# Patient Record
Sex: Female | Born: 1995 | Race: White | Hispanic: No | Marital: Single | Smoking: Never smoker
Health system: Southern US, Community
[De-identification: ages and names within clinical notes are randomized; demographics above are authoritative.]

## PROBLEM LIST (undated history)

## (undated) DIAGNOSIS — J45909 Unspecified asthma, uncomplicated: Secondary | ICD-10-CM

## (undated) DIAGNOSIS — B009 Herpesviral infection, unspecified: Secondary | ICD-10-CM

## (undated) HISTORY — PX: WISDOM TOOTH EXTRACTION: SHX21

## (undated) HISTORY — DX: Unspecified asthma, uncomplicated: J45.909

## (undated) HISTORY — DX: Herpesviral infection, unspecified: B00.9

---
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
## 2019-06-18 ENCOUNTER — Other Ambulatory Visit: Payer: Self-pay | Admitting: *Deleted

## 2019-06-18 DIAGNOSIS — Z20822 Contact with and (suspected) exposure to covid-19: Secondary | ICD-10-CM

## 2019-06-20 LAB — NOVEL CORONAVIRUS, NAA: SARS-CoV-2, NAA: NOT DETECTED

## 2019-06-21 ENCOUNTER — Telehealth: Payer: Self-pay | Admitting: General Practice

## 2019-06-21 NOTE — Telephone Encounter (Signed)
Patient is calling to receive her negative COVID test results. Patient expressed understanding. 

## 2020-06-08 LAB — OB RESULTS CONSOLE GBS: GBS: POSITIVE

## 2020-06-19 LAB — OB RESULTS CONSOLE RPR: RPR: NONREACTIVE

## 2020-06-19 LAB — OB RESULTS CONSOLE HIV ANTIBODY (ROUTINE TESTING): HIV: NONREACTIVE

## 2020-06-19 LAB — OB RESULTS CONSOLE ABO/RH: RH Type: POSITIVE

## 2020-06-19 LAB — OB RESULTS CONSOLE HEPATITIS B SURFACE ANTIGEN: Hepatitis B Surface Ag: NEGATIVE

## 2020-06-19 LAB — OB RESULTS CONSOLE RUBELLA ANTIBODY, IGM: Rubella: IMMUNE

## 2020-06-19 LAB — OB RESULTS CONSOLE GC/CHLAMYDIA
Chlamydia: NEGATIVE
Gonorrhea: NEGATIVE

## 2020-06-19 LAB — HEPATITIS C ANTIBODY: HCV Ab: NEGATIVE

## 2020-07-26 ENCOUNTER — Other Ambulatory Visit: Payer: Self-pay | Admitting: Obstetrics & Gynecology

## 2020-07-26 DIAGNOSIS — Z363 Encounter for antenatal screening for malformations: Secondary | ICD-10-CM

## 2020-08-12 NOTE — L&D Delivery Note (Signed)
Delivery Note Colleen Daniel is a G1P0 at [redacted]w[redacted]d who had a precipitous spontaneous delivery at 33  a viable female was delivered via ROA with compound hand presentation.  APGAR: 8, 9; weight 7lb 2.2oz (3239g(  .    Progressed rapidly in MAU from 2 to 3 to 10cm. I was called to attend delivery at 10:30, found patient to be 10/100/+3 and bearing down with contractions. AROM clear fluid at 1033, pushed effectively to deliver at 1036.  No nuchal cord. 60 second delayed cord clamping performed. Placenta delivery spontaneously and intact. IM pitocin given. IM toradol given  Vaginal exam limited by patient discomfort, small superficial right vaginal laceration noted but rendered hemostatic with pressure. Uterine fundus firm and good hemostasis noted.   Instrument and gauze counts were correct at the end of the procedure.   Placenta status: to L&D  Anesthesia:  none Episiotomy:  none Lacerations:   superficial right vaginal laceration did not require repair Suture Repair: none Est. Blood Loss (mL):   Mom to postpartum.  Baby to Couplet care / Skin to Skin.  Charlett Nose 01/05/2021, 11:12 AM

## 2020-08-17 ENCOUNTER — Encounter: Payer: Self-pay | Admitting: *Deleted

## 2020-08-21 ENCOUNTER — Other Ambulatory Visit: Payer: Self-pay | Admitting: *Deleted

## 2020-08-21 ENCOUNTER — Ambulatory Visit: Payer: BC Managed Care – PPO | Attending: Obstetrics & Gynecology

## 2020-08-21 ENCOUNTER — Other Ambulatory Visit: Payer: Self-pay

## 2020-08-21 DIAGNOSIS — Z363 Encounter for antenatal screening for malformations: Secondary | ICD-10-CM | POA: Insufficient documentation

## 2020-08-21 DIAGNOSIS — Z3A19 19 weeks gestation of pregnancy: Secondary | ICD-10-CM

## 2020-08-21 DIAGNOSIS — Z362 Encounter for other antenatal screening follow-up: Secondary | ICD-10-CM

## 2020-08-21 IMAGING — US US MFM OB COMP +14 WKS
1 series · 13 of 28 positions shown · non-contrast
Comparison: none

[Series 1: us mfm ob comp +14 wks · 13 of 128 slices shown]
[im 5/128]
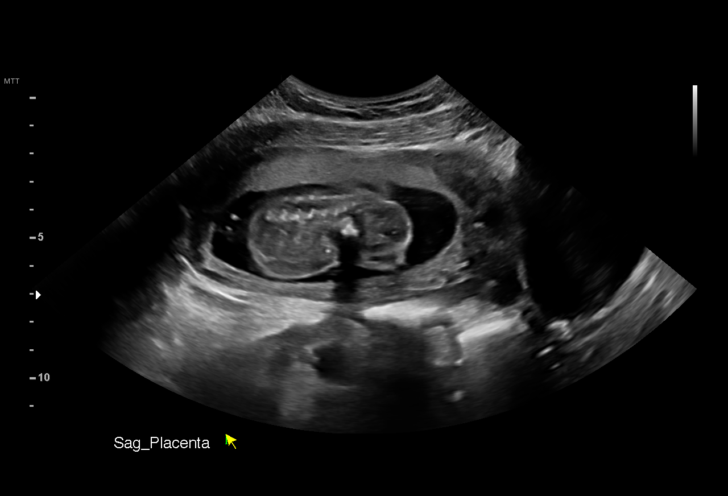
[im 15/128]
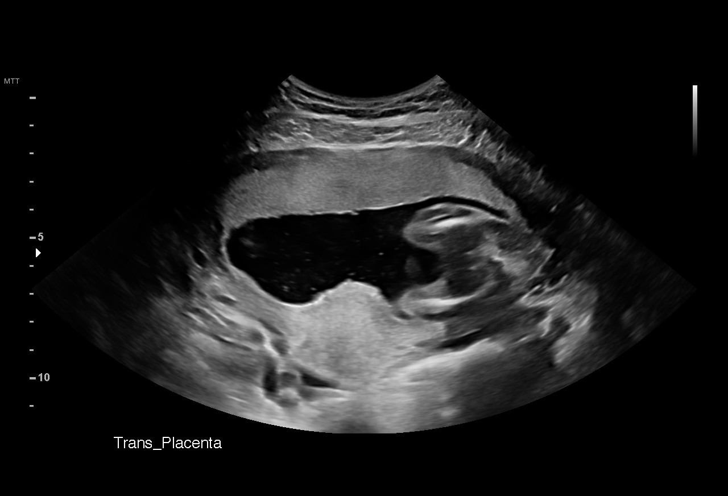
[im 24/128]
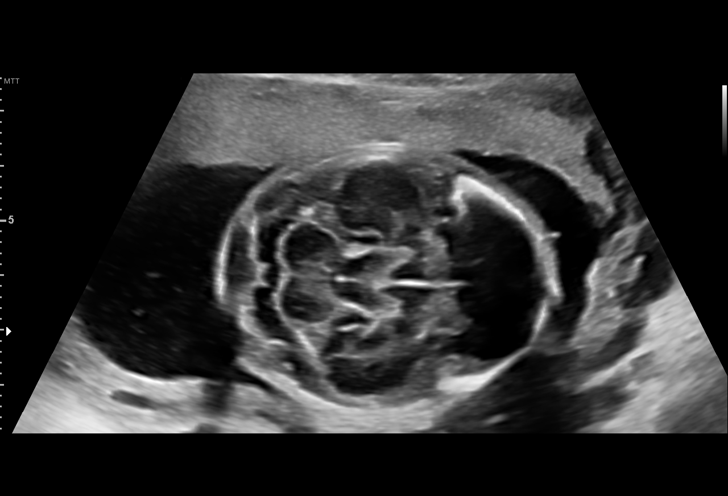
[im 33/128]
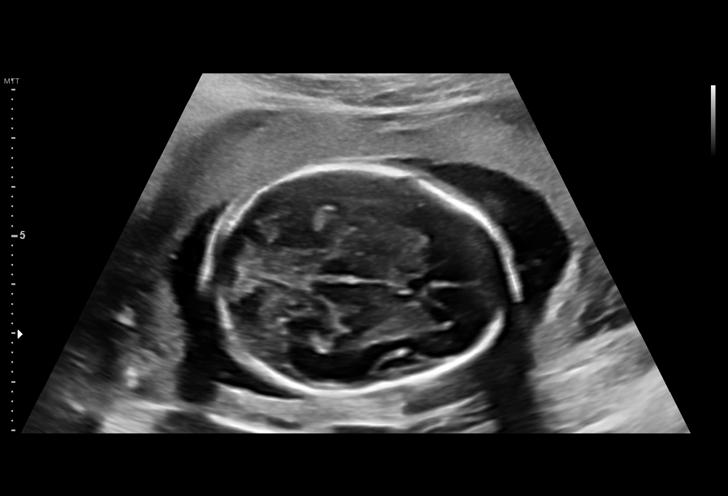
[im 43/128]
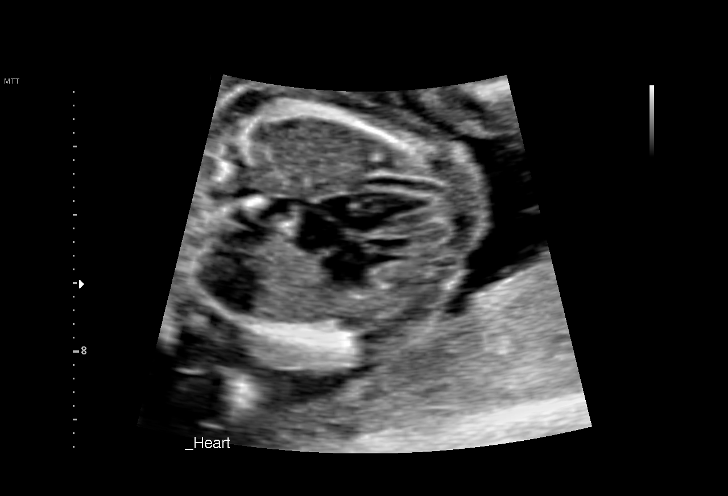
[im 52/128]
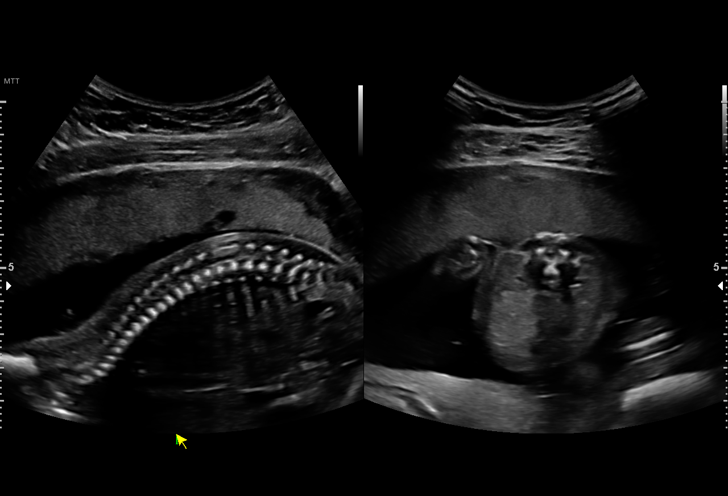
[im 66/128]
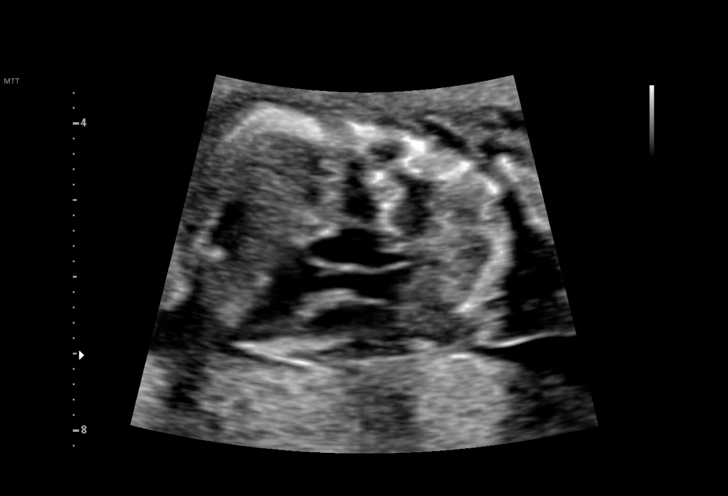
[im 76/128]
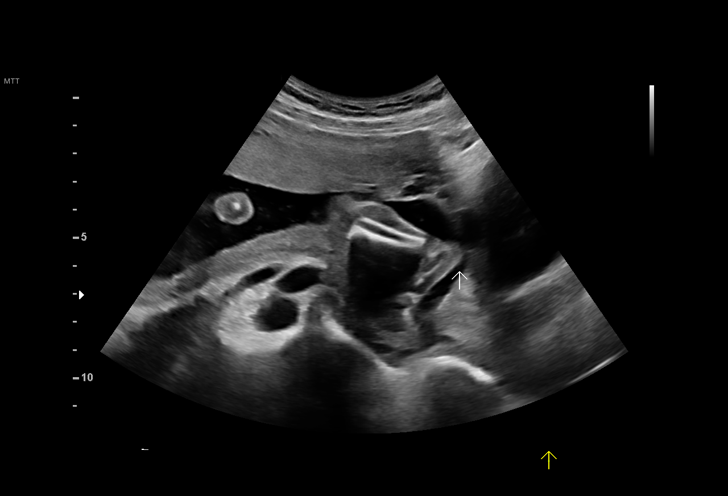
[im 85/128]
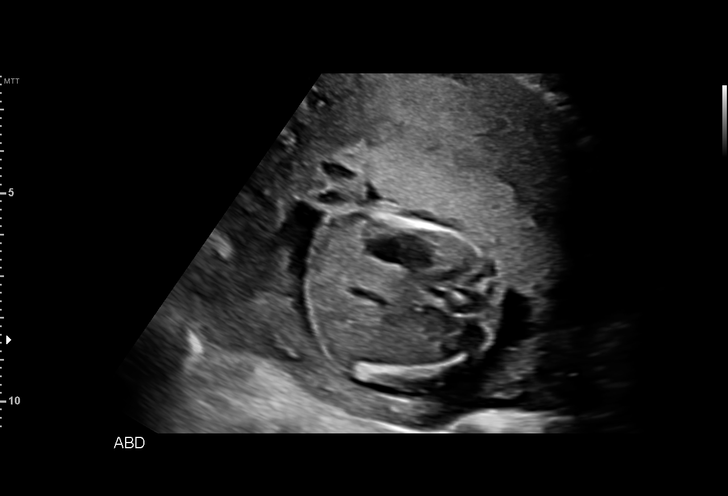
[im 95/128]
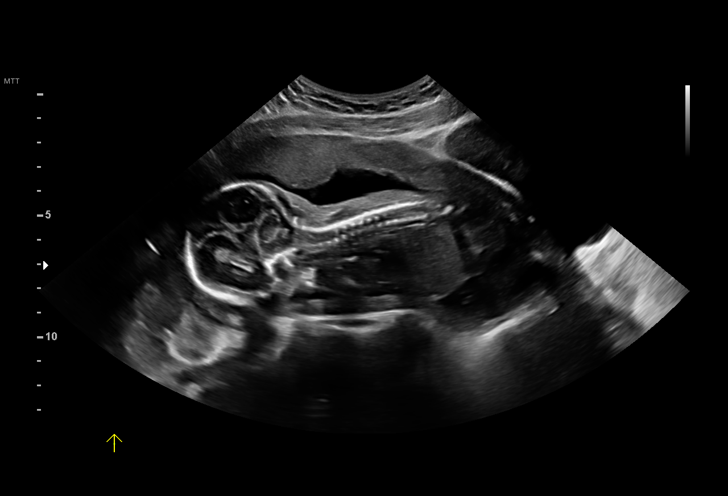
[im 104/128]
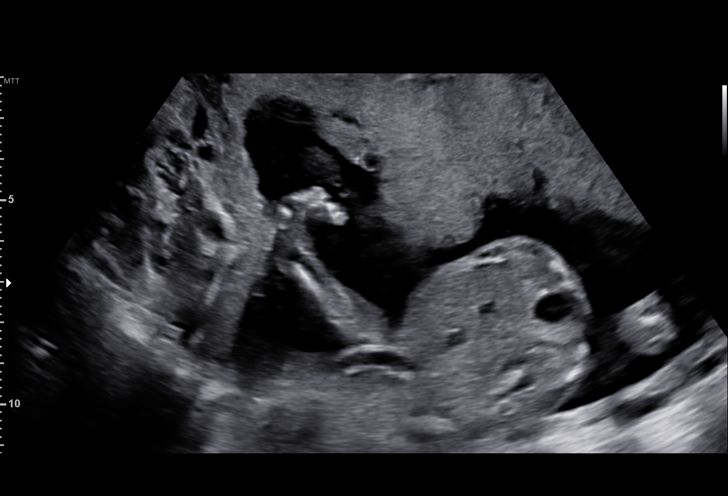
[im 113/128]
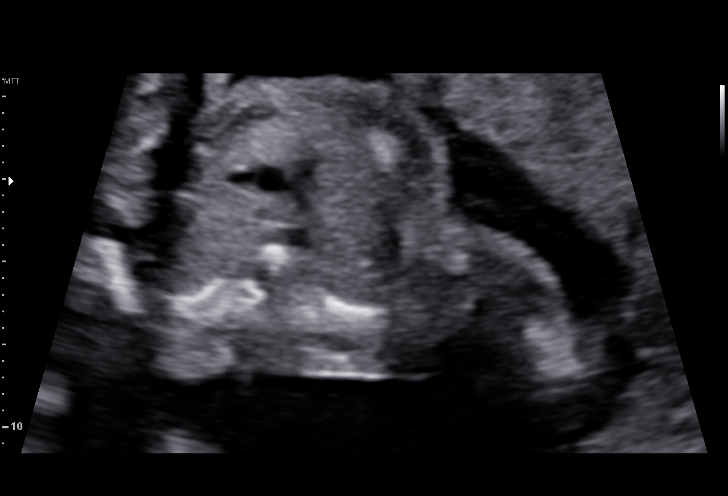
[im 123/128]
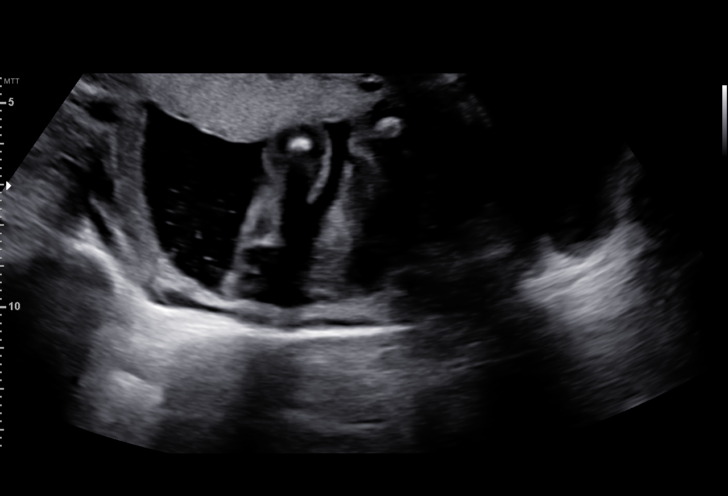

[13 of 28 positions shown; findings below may reference images not displayed]

MOTONORI

 1  US MFM OB COMP + 14 WK                76805.01    MOTONORI
                                                      MOTONORI

Indications

 Encounter for antenatal screening for          [EZ]
 malformations
 19 weeks gestation of pregnancy
 LR NIPS
Fetal Evaluation

 Num Of Fetuses:         1
 Fetal Heart Rate(bpm):  162
 Cardiac Activity:       Observed
 Presentation:           Variable
 Placenta:               Anterior
 P. Cord Insertion:      Visualized, central

 Amniotic Fluid
 AFI FV:      Within normal limits

                             Largest Pocket(cm)

Biometry

 BPD:      46.9  mm     G. Age:  20w 1d         80  %    CI:        72.64   %    70 - 86
                                                         FL/HC:      17.2   %    16.1 -
 HC:       175   mm     G. Age:  20w 0d         71  %    HC/AC:      1.20        1.09 -
 AC:      146.4  mm     G. Age:  20w 0d         63  %    FL/BPD:     64.2   %
 FL:       30.1  mm     G. Age:  19w 2d         38  %    FL/AC:      20.6   %    20 - 24
 HUM:      29.2  mm     G. Age:  19w 4d         54  %
 CER:      20.5  mm     G. Age:  19w 5d         64  %
 NFT:       5.2  mm
 LV:        7.3  mm
 CM:        3.7  mm
 Est. FW:     308  gm    0 lb 11 oz      62  %
OB History

 Blood Type:   O+
 Gravidity:    1
Gestational Age

 LMP:           19w 3d        Date:  [DATE]                 EDD:   [DATE]
 U/S Today:     19w 6d                                        EDD:   [DATE]
 Best:          19w 3d     Det. By:  LMP  ([DATE])          EDD:   [DATE]
Anatomy

 Cranium:               Appears normal         Aortic Arch:            Appears normal
 Cavum:                 Appears normal         Ductal Arch:            Not well visualized
 Ventricles:            Appears normal         Diaphragm:              Appears normal
 Choroid Plexus:        Appears normal         Stomach:                Appears normal, left
                                                                       sided
 Cerebellum:            Appears normal         Abdomen:                Appears normal
 Posterior Fossa:       Appears normal         Abdominal Wall:         Appears nml (cord
                                                                       insert, abd wall)
 Nuchal Fold:           Appears normal         Cord Vessels:           Appears normal (3
                                                                       vessel cord)
 Face:                  Orbits nl; profile not Kidneys:                Appear normal
                        well visualized
 Lips:                  Not well visualized    Bladder:                Appears normal
 Thoracic:              Appears normal         Spine:                  Appears normal
 Heart:                 Appears normal         Upper Extremities:      Appears normal
                        (4CH, axis, and
                        situs)
 RVOT:                  Not well visualized    Lower Extremities:      Appears normal
 LVOT:                  Appears normal

 Other:  Fetus appears to be a male. Left Heels and Right 5th digit visualized.
         Technically difficult due to fetal position.
Cervix Uterus Adnexa

 Cervix
 Length:           4.82  cm.
 Normal appearance by transabdominal scan.

 Uterus
 No abnormality visualized.

 Right Ovary
 Within normal limits.

 Left Ovary
 Within normal limits.

 Cul De Sac
 No free fluid seen.

 Adnexa
 No abnormality visualized.
Comments

 This patient was seen for a detailed fetal anatomy scan.
 She denies any significant past medical history and denies
 any problems in her current pregnancy.
 She had a cell free DNA test earlier in her pregnancy which
 indicated a low risk for trisomy 21, 18, and 13. A male fetus is
 predicted.
 She was informed that the fetal growth and amniotic fluid
 level were appropriate for her gestational age.
 There were no obvious fetal anomalies noted on today's
 ultrasound exam.  However, the views of the fetal anatomy
 were limited today due to the fetal position.
 The patient was informed that anomalies may be missed due
 to technical limitations. If the fetus is in a suboptimal position
 or maternal habitus is increased, visualization of the fetus in
 the maternal uterus may be impaired.
 A follow-up exam was scheduled in 4 weeks to complete the
 views of the fetal anatomy.

## 2020-09-18 ENCOUNTER — Ambulatory Visit: Payer: BC Managed Care – PPO | Attending: Obstetrics and Gynecology

## 2020-09-18 ENCOUNTER — Encounter: Payer: Self-pay | Admitting: *Deleted

## 2020-09-18 ENCOUNTER — Ambulatory Visit: Payer: BC Managed Care – PPO | Admitting: *Deleted

## 2020-09-18 ENCOUNTER — Other Ambulatory Visit: Payer: Self-pay

## 2020-09-18 VITALS — Ht 66.0 in

## 2020-09-18 DIAGNOSIS — Z362 Encounter for other antenatal screening follow-up: Secondary | ICD-10-CM | POA: Diagnosis present

## 2020-09-18 DIAGNOSIS — Z3A23 23 weeks gestation of pregnancy: Secondary | ICD-10-CM | POA: Diagnosis not present

## 2020-09-18 IMAGING — US US MFM OB FOLLOW-UP
1 series · 14 of 28 positions shown · non-contrast
Comparison: none

[Series 1: us mfm ob follow-up · 14 of 114 slices shown]
[im 5/114]
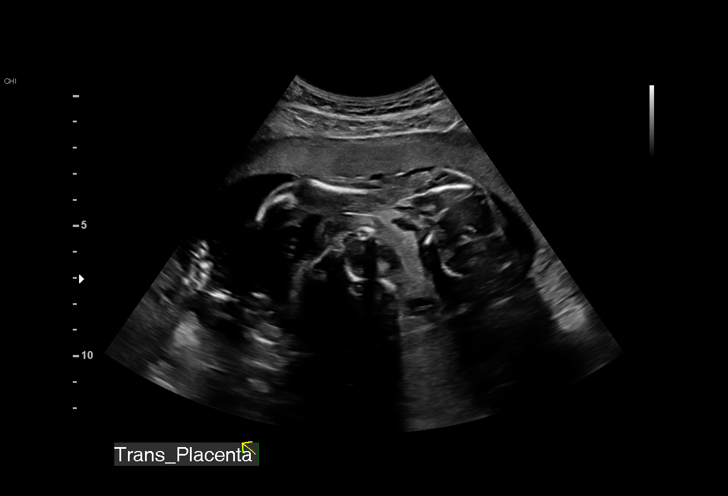
[im 13/114]
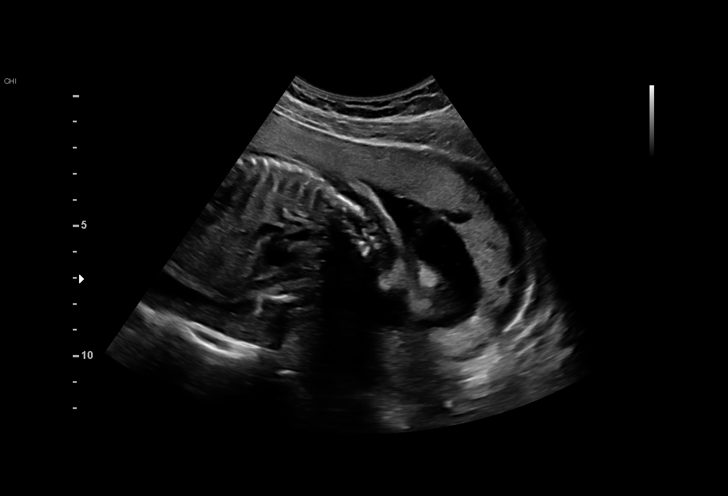
[im 21/114]
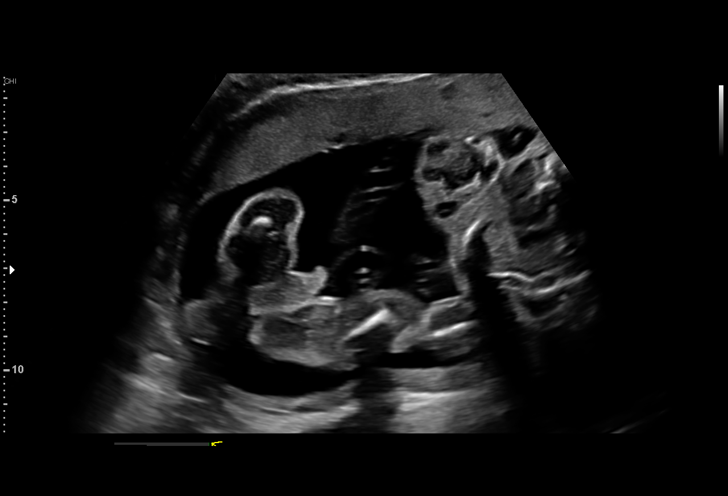
[im 30/114]
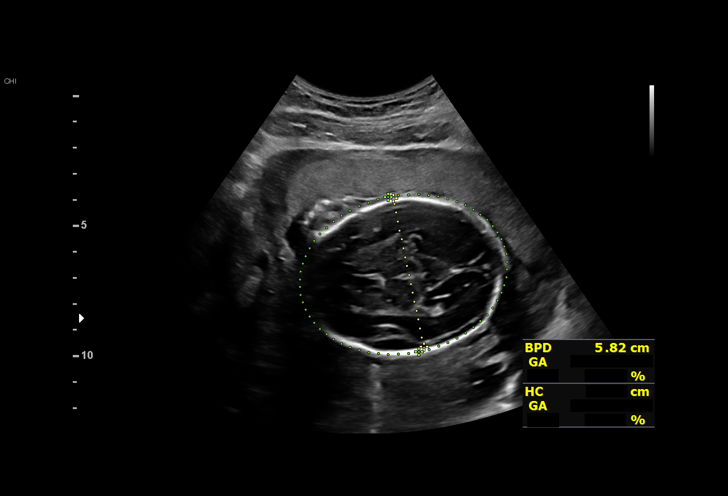
[im 38/114]
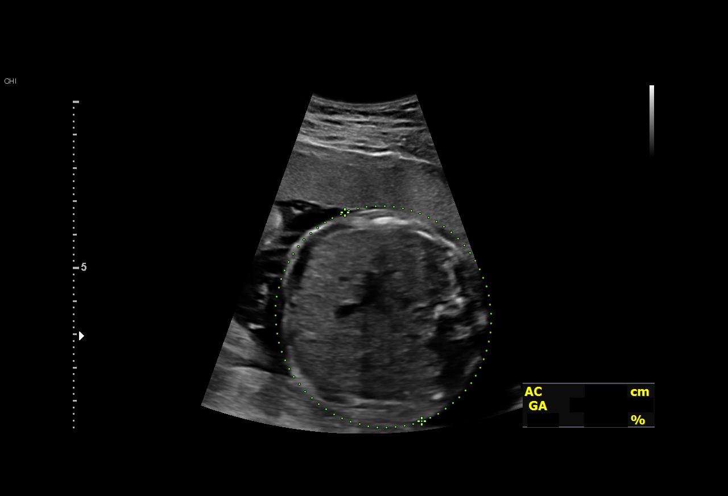
[im 47/114]
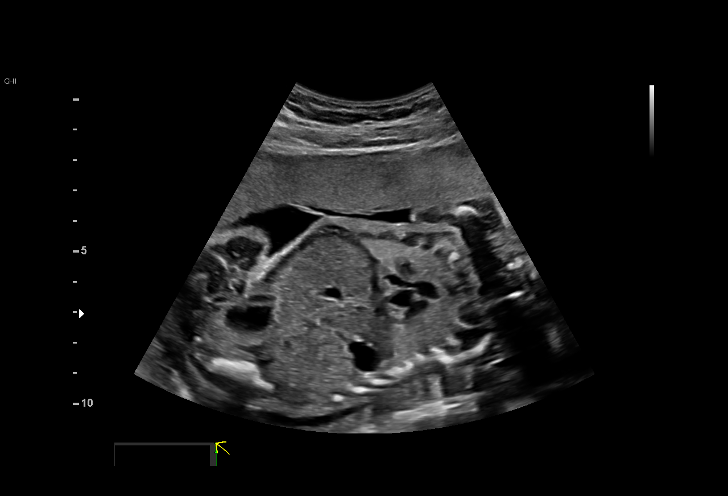
[im 55/114]
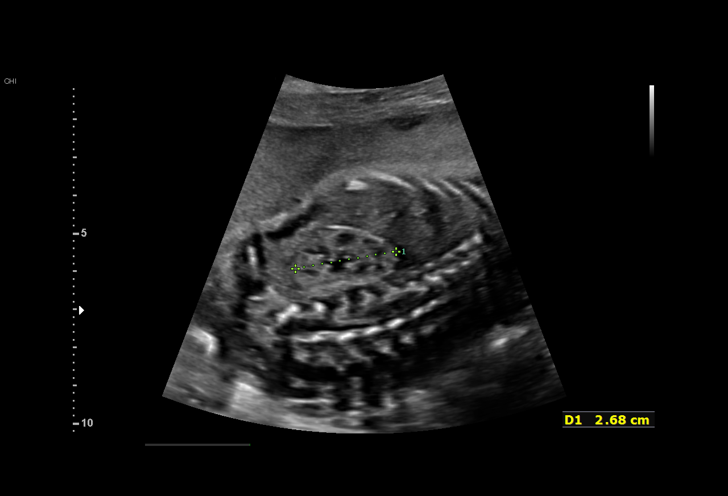
[im 63/114]
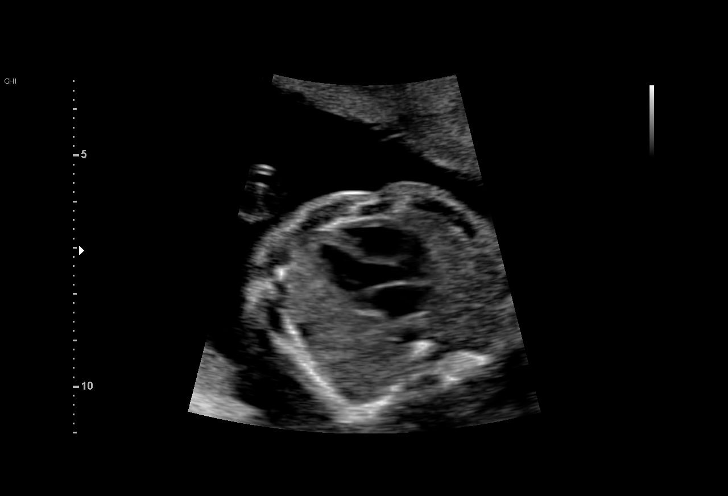
[im 72/114]
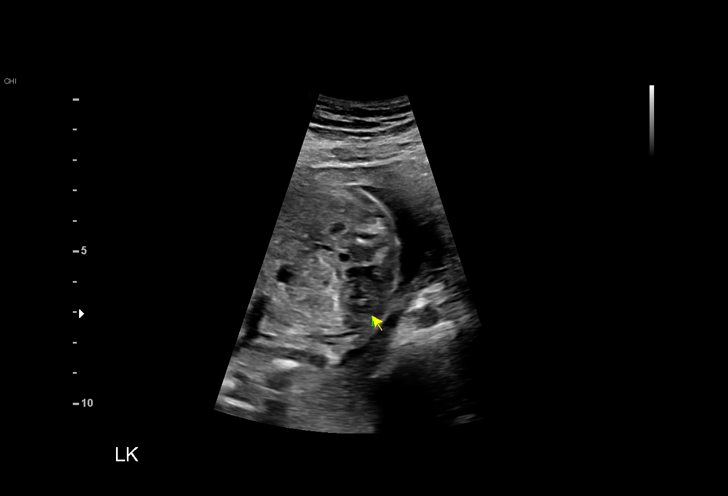
[im 80/114]
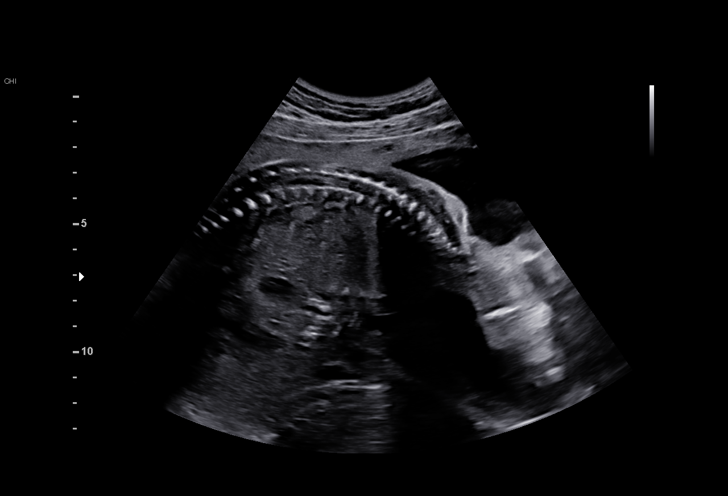
[im 88/114]
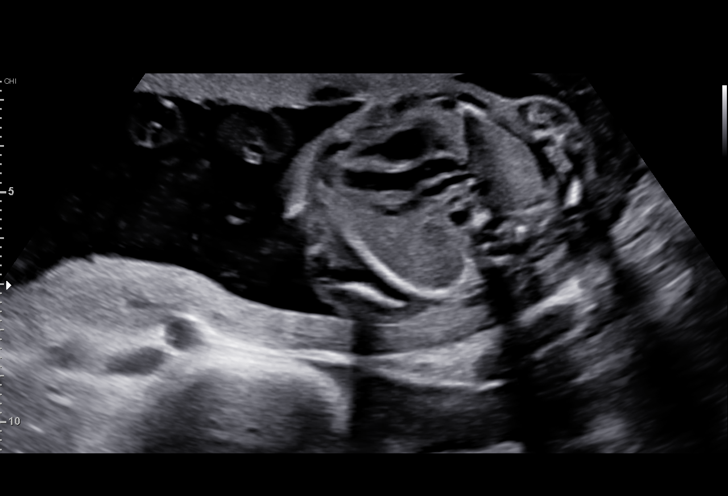
[im 97/114]
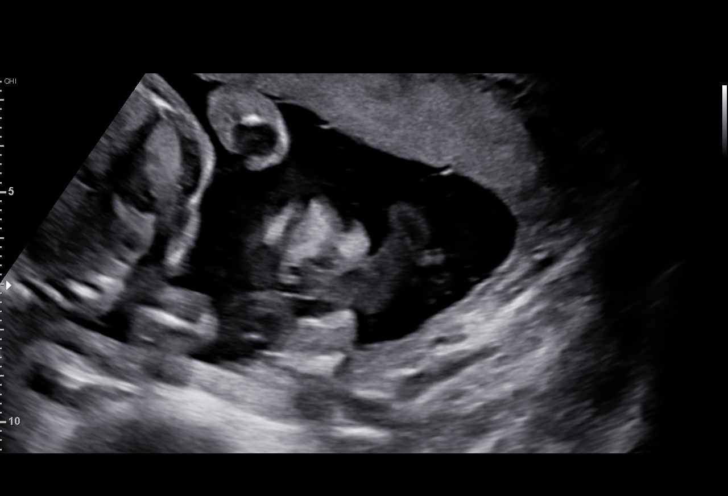
[im 105/114]
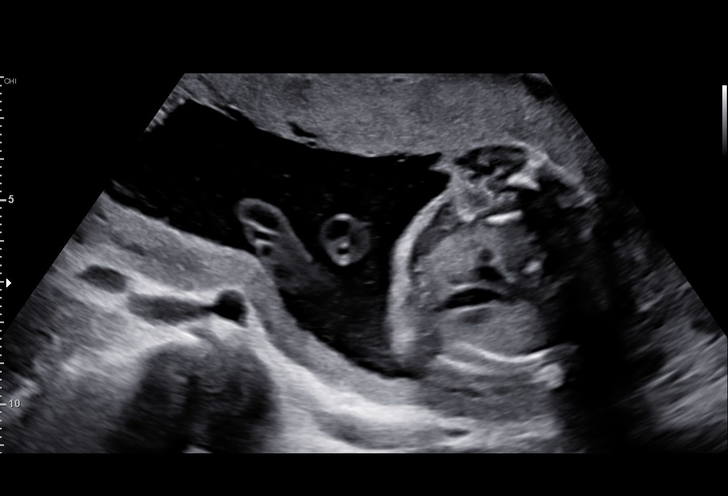
[im 114/114]
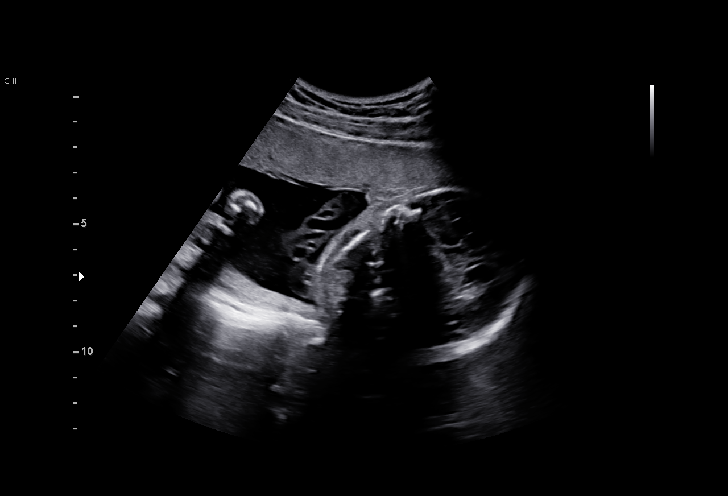

[14 of 28 positions shown; findings below may reference images not displayed]

OBGYN
                   VADIVELU

Indications

 Antenatal follow-up for nonvisualized fetal
 anatomy
 23 weeks gestation of pregnancy
Fetal Evaluation

 Num Of Fetuses:         1
 Fetal Heart Rate(bpm):  143
 Cardiac Activity:       Observed
 Presentation:           Cephalic
 Placenta:               Anterior
 P. Cord Insertion:      Previously Visualized

 Amniotic Fluid
 AFI FV:      Within normal limits

                             Largest Pocket(cm)

Biometry

 BPD:      58.3  mm     G. Age:  23w 6d         62  %    CI:        69.67   %    70 - 86
                                                         FL/HC:      18.7   %    19.2 -
 HC:      222.9  mm     G. Age:  24w 2d         69  %    HC/AC:      1.10        1.05 -
 AC:      202.9  mm     G. Age:  24w 6d         84  %    FL/BPD:     71.4   %    71 - 87
 FL:       41.6  mm     G. Age:  23w 4d         41  %    FL/AC:      20.5   %    20 - 24

 Est. FW:     680  gm      1 lb 8 oz     82  %
OB History

 Blood Type:   O+
 Gravidity:    1
Gestational Age

 LMP:           23w 3d        Date:  04/07/20                 EDD:   01/12/21
 U/S Today:     24w 1d                                        EDD:   01/07/21
 Best:          23w 3d     Det. By:  LMP  (04/07/20)          EDD:   01/12/21
Anatomy

 Cranium:               Appears normal         Aortic Arch:            Previously seen
 Cavum:                 Appears normal         Ductal Arch:            Appears normal
 Ventricles:            Appears normal         Diaphragm:              Appears normal
 Choroid Plexus:        Appears normal         Stomach:                Appears normal, left
                                                                       sided
 Cerebellum:            Previously seen        Abdomen:                Appears normal
 Posterior Fossa:       Previously seen        Abdominal Wall:         Appears nml (cord
                                                                       insert, abd wall)
 Nuchal Fold:           Previously seen        Cord Vessels:           Appears normal (3
                                                                       vessel cord)
 Face:                  Orbits prev seen;      Kidneys:                Appear normal
                        Profile WNL
 Lips:                  Appears normal         Bladder:                Appears normal
 Thoracic:              Appears normal         Spine:                  Previously seen
 Heart:                 Appears normal         Upper Extremities:      Previously seen
                        (4CH, axis, and
                        situs)
 RVOT:                  Appears normal         Lower Extremities:      Previously seen
 LVOT:                  Appears normal

 Other:  Fetus appears to be a male. Left Heels and Right 5th digit previously
         visualized. 3VV and 3VTV visualized.
Cervix Uterus Adnexa

 Cervix
 Length:            3.8  cm.
 Normal appearance by transabdominal scan.
Comments

 This patient was seen for a follow up exam as the views of
 the fetal anatomy were unable to be fully visualized during
 her last exam.  She denies any problems since her last exam.
 She was informed that the fetal growth and amniotic fluid
 level appears appropriate for her gestational age.
 The views of the fetal anatomy were visualized today.  There
 were no obvious anomalies noted.
 The limitations of ultrasound in the detection of all anomalies
 was discussed.
 Follow-up as indicated.

## 2020-10-16 LAB — OB RESULTS CONSOLE RPR: RPR: NONREACTIVE

## 2021-01-05 ENCOUNTER — Other Ambulatory Visit: Payer: Self-pay

## 2021-01-05 ENCOUNTER — Inpatient Hospital Stay (HOSPITAL_COMMUNITY)
Admission: AD | Admit: 2021-01-05 | Discharge: 2021-01-06 | DRG: 806 | Disposition: A | Discharge status: Home / Self Care | Payer: BC Managed Care – PPO | Attending: Obstetrics and Gynecology | Admitting: Obstetrics and Gynecology

## 2021-01-05 ENCOUNTER — Encounter (HOSPITAL_COMMUNITY): Payer: Self-pay | Admitting: Obstetrics and Gynecology

## 2021-01-05 DIAGNOSIS — Z20822 Contact with and (suspected) exposure to covid-19: Secondary | ICD-10-CM | POA: Diagnosis present

## 2021-01-05 DIAGNOSIS — O322XX0 Maternal care for transverse and oblique lie, not applicable or unspecified: Secondary | ICD-10-CM | POA: Diagnosis present

## 2021-01-05 DIAGNOSIS — O26893 Other specified pregnancy related conditions, third trimester: Secondary | ICD-10-CM | POA: Diagnosis present

## 2021-01-05 DIAGNOSIS — Z3A39 39 weeks gestation of pregnancy: Secondary | ICD-10-CM | POA: Diagnosis not present

## 2021-01-05 DIAGNOSIS — O99824 Streptococcus B carrier state complicating childbirth: Secondary | ICD-10-CM | POA: Diagnosis present

## 2021-01-05 LAB — RESP PANEL BY RT-PCR (FLU A&B, COVID) ARPGX2
Influenza A by PCR: NEGATIVE
Influenza B by PCR: NEGATIVE
SARS Coronavirus 2 by RT PCR: NEGATIVE

## 2021-01-05 LAB — CBC
HCT: 34.6 % — ABNORMAL LOW (ref 36.0–46.0)
Hemoglobin: 12.2 g/dL (ref 12.0–15.0)
MCH: 31.3 pg (ref 26.0–34.0)
MCHC: 35.3 g/dL (ref 30.0–36.0)
MCV: 88.7 fL (ref 80.0–100.0)
Platelets: 263 10*3/uL (ref 150–400)
RBC: 3.9 MIL/uL (ref 3.87–5.11)
RDW: 12.7 % (ref 11.5–15.5)
WBC: 22.9 10*3/uL — ABNORMAL HIGH (ref 4.0–10.5)
nRBC: 0 % (ref 0.0–0.2)

## 2021-01-05 LAB — TYPE AND SCREEN
ABO/RH(D): O POS
Antibody Screen: NEGATIVE

## 2021-01-05 MED ORDER — SOD CITRATE-CITRIC ACID 500-334 MG/5ML PO SOLN: 30.0000 mL | ORAL | Status: DC | PRN

## 2021-01-05 MED ORDER — OXYTOCIN 10 UNIT/ML IJ SOLN
INTRAMUSCULAR | Status: AC
  Filled 2021-01-05: qty 1

## 2021-01-05 MED ORDER — PRENATAL MULTIVITAMIN CH
1.0000 | ORAL_TABLET | Freq: Every day | ORAL | Status: DC
  Administered 2021-01-06: 1 via ORAL
  Filled 2021-01-05: qty 1

## 2021-01-05 MED ORDER — ONDANSETRON HCL 4 MG/2ML IJ SOLN: 4.0000 mg | Freq: Four times a day (QID) | INTRAMUSCULAR | Status: DC | PRN

## 2021-01-05 MED ORDER — BENZOCAINE-MENTHOL 20-0.5 % EX AERO
1.0000 "application " | INHALATION_SPRAY | CUTANEOUS | Status: DC | PRN
  Administered 2021-01-05: 1 via TOPICAL
  Filled 2021-01-05: qty 56

## 2021-01-05 MED ORDER — BISACODYL 10 MG RE SUPP: 10.0000 mg | Freq: Every day | RECTAL | Status: DC | PRN

## 2021-01-05 MED ORDER — OXYTOCIN-SODIUM CHLORIDE 30-0.9 UT/500ML-% IV SOLN: 2.5000 [IU]/h | INTRAVENOUS | Status: DC

## 2021-01-05 MED ORDER — IBUPROFEN 600 MG PO TABS
600.0000 mg | ORAL_TABLET | Freq: Four times a day (QID) | ORAL | Status: DC
  Administered 2021-01-05 – 2021-01-06 (×4): 600 mg via ORAL
  Filled 2021-01-05 (×5): qty 1

## 2021-01-05 MED ORDER — LIDOCAINE HCL (PF) 1 % IJ SOLN
30.0000 mL | INTRAMUSCULAR | Status: DC | PRN
  Filled 2021-01-05 (×2): qty 30

## 2021-01-05 MED ORDER — KETOROLAC TROMETHAMINE 30 MG/ML IJ SOLN
INTRAMUSCULAR | Status: AC
  Administered 2021-01-05: 30 mg via INTRAMUSCULAR
  Filled 2021-01-05: qty 1

## 2021-01-05 MED ORDER — SIMETHICONE 80 MG PO CHEW: 80.0000 mg | CHEWABLE_TABLET | ORAL | Status: DC | PRN

## 2021-01-05 MED ORDER — ONDANSETRON HCL 4 MG PO TABS: 4.0000 mg | ORAL_TABLET | ORAL | Status: DC | PRN

## 2021-01-05 MED ORDER — LACTATED RINGERS IV SOLN: INTRAVENOUS | Status: DC

## 2021-01-05 MED ORDER — OXYCODONE HCL 5 MG PO TABS: 5.0000 mg | ORAL_TABLET | ORAL | Status: DC | PRN

## 2021-01-05 MED ORDER — DIBUCAINE (PERIANAL) 1 % EX OINT: 1.0000 "application " | TOPICAL_OINTMENT | CUTANEOUS | Status: DC | PRN

## 2021-01-05 MED ORDER — ONDANSETRON HCL 4 MG/2ML IJ SOLN: 4.0000 mg | INTRAMUSCULAR | Status: DC | PRN

## 2021-01-05 MED ORDER — WITCH HAZEL-GLYCERIN EX PADS: 1.0000 "application " | MEDICATED_PAD | CUTANEOUS | Status: DC | PRN

## 2021-01-05 MED ORDER — OXYCODONE HCL 5 MG PO TABS: 10.0000 mg | ORAL_TABLET | ORAL | Status: DC | PRN

## 2021-01-05 MED ORDER — ACETAMINOPHEN 325 MG PO TABS: 650.0000 mg | ORAL_TABLET | ORAL | Status: DC | PRN

## 2021-01-05 MED ORDER — SENNOSIDES-DOCUSATE SODIUM 8.6-50 MG PO TABS
2.0000 | ORAL_TABLET | Freq: Every day | ORAL | Status: DC
  Administered 2021-01-06: 2 via ORAL
  Filled 2021-01-05: qty 2

## 2021-01-05 MED ORDER — OXYTOCIN BOLUS FROM INFUSION: 333.0000 mL | Freq: Once | INTRAVENOUS | Status: DC

## 2021-01-05 MED ORDER — KETOROLAC TROMETHAMINE 30 MG/ML IJ SOLN: 30.0000 mg | Freq: Once | INTRAMUSCULAR | Status: DC

## 2021-01-05 MED ORDER — FLEET ENEMA 7-19 GM/118ML RE ENEM: 1.0000 | ENEMA | Freq: Every day | RECTAL | Status: DC | PRN

## 2021-01-05 MED ORDER — DIPHENHYDRAMINE HCL 25 MG PO CAPS: 25.0000 mg | ORAL_CAPSULE | Freq: Four times a day (QID) | ORAL | Status: DC | PRN

## 2021-01-05 MED ORDER — OXYTOCIN 10 UNIT/ML IJ SOLN
10.0000 [IU] | Freq: Once | INTRAMUSCULAR | Status: AC
  Administered 2021-01-05: 10 [IU] via INTRAMUSCULAR

## 2021-01-05 MED ORDER — LACTATED RINGERS IV SOLN: 500.0000 mL | INTRAVENOUS | Status: DC | PRN

## 2021-01-05 MED ORDER — COCONUT OIL OIL: 1.0000 "application " | TOPICAL_OIL | Status: DC | PRN

## 2021-01-05 MED ORDER — TETANUS-DIPHTH-ACELL PERTUSSIS 5-2.5-18.5 LF-MCG/0.5 IM SUSY: 0.5000 mL | PREFILLED_SYRINGE | Freq: Once | INTRAMUSCULAR | Status: DC

## 2021-01-05 NOTE — Lactation Note (Signed)
This note was copied from a baby's chart. Lactation Consultation Note  Patient Name: Colleen Daniel [id number] Date: 01/05/2021   Age:25 hours  Maternal Data   Baby Colleen Wilson Singer now 5 hours old. STS with mom.  A little gaggy at this time. Showed mom how to hand express.  Dad rubbed small drops of colostrum on his gums.  Mom has DEBP for home use.  Mom requested manual pump.  Gave manual pump. Left STS.  Urged mom to call lactation as needed. Feeding    LATCH Score                    Lactation Tools Discussed/Used    Interventions    Discharge    Consult Status      Neomia Dear 01/05/2021, 4:49 PM

## 2021-01-05 NOTE — H&P (Signed)
Colleen Daniel is a 25 y.o. female G1P0 [redacted]w[redacted]d presented to MAU this AM for labor evaluation. Initially exam was 2.5/80/-2, about 1.5 hours later, exam 3/90/-2. She then progressed rapidly to 10/100/+3 and I was called to attend delivery in MAU. See delivery note.   Pregnancy c/b: 1. GBS positive  OB History    Gravida  1   Para      Term      Preterm      AB      Living        SAB      IAB      Ectopic      Multiple      Live Births             Past Medical History:  Diagnosis Date  . Asthma    Childhood   Past Surgical History:  Procedure Laterality Date  . WISDOM TOOTH EXTRACTION     Family History: family history is not on file. Social History:  reports that she has never smoked. She has never used smokeless tobacco. She reports previous alcohol use. She reports that she does not use drugs.     Maternal Diabetes: No Genetic Screening: Normal Maternal Ultrasounds/Referrals: Normal Fetal Ultrasounds or other Referrals:  None Maternal Substance Abuse:  No Significant Maternal Medications:  None Significant Maternal Lab Results:  Group B Strep positive Other Comments:  None  Review of Systems Per HPI Exam Physical Exam  Dilation:  (SVD of viable female) Effacement (%): 90 Station: Plus 3 Exam by:: Erle Crocker, RN Blood pressure 129/74, pulse 91, resp. rate 19, last menstrual period 04/07/2020, SpO2 99 %.  Gen: patient uncomfortable with contractions, bearing down with contractions SVE 10/100/+3 FH  Prenatal labs: ABO, Rh:  O/Positive/-- (11/08 0000) Antibody:   Rubella: Immune (11/08 0000) RPR: Nonreactive (03/07 0000)  HBsAg: Negative (11/08 0000)  HIV: Non-reactive (11/08 0000)  GBS: Positive/-- (10/28 0000)   Assessment/Plan: 24Y G1P0 @ [redacted]w[redacted]d, precipitous SVD in MAU, see delivery note  - GBS+: did not receive antibiotics, AROM just prior to delivery - IM pit, IM toradol - Admit to postpartum, routine postpartum care - Labs  pending    Charlett Nose 01/05/2021, 11:10 AM

## 2021-01-05 NOTE — MAU Note (Signed)
...  Colleen Daniel is a 25 y.o. at [redacted]w[redacted]d here in MAU reporting: CTX since 0830 last night. CTX currently 5 minutes apart. +FM. No VB or LOF. GBS+.  Pain score: 6/10 lower abdominal

## 2021-01-05 NOTE — MAU Provider Note (Signed)
None      S: Ms. Colleen Daniel is a 25 y.o. G1P0 at [redacted]w[redacted]d  who presents to MAU today complaining contractions q five minutes since 0830 last night. She denies vaginal bleeding. She denies LOF. She reports normal fetal movement.    O: BP 129/78   Pulse 77   Resp 19   LMP 04/07/2020   SpO2 99%  GENERAL: Well-developed, well-nourished female in no acute distress.  HEAD: Normocephalic, atraumatic.  CHEST: Normal effort of breathing, regular heart rate ABDOMEN: Soft, nontender, gravid  Cervical exam:  Dilation: 3 Effacement (%): 90 Cervical Position: Middle Station: Plus 3 Presentation: Vertex Exam by:: Erle Crocker, RN   Fetal Monitoring: Baseline: 135 Variability: Mod Accelerations: 15 x 15 Decelerations: None Contractions: 1-3 min   A/P --SIUP at [redacted]w[redacted]d  --Reactive tracing --GBS + --Patient with negligible change after initial 90 minutes of observation --Given additional hour to ambulate in room --Patient with new onset very loud grunting, CNM and Charge RN at bedside, patient endorsing new and acute onset urge to push, rocking on commode --Patient assisted to bed by [facility] RN and CNM.  --Determined by CNM to be C/C/+2, involuntarily pushing --Patient not stable for transfer to L&D, not stable for transfer to room 20 --Dr. Timothy Lasso notified, at bedside within three minutes, and present for AROM and birth  Calvert Cantor, [place] 01/05/2021 12:17 PM

## 2021-01-06 LAB — CBC
HCT: 28.7 % — ABNORMAL LOW (ref 36.0–46.0)
Hemoglobin: 10.1 g/dL — ABNORMAL LOW (ref 12.0–15.0)
MCH: 31.5 pg (ref 26.0–34.0)
MCHC: 35.2 g/dL (ref 30.0–36.0)
MCV: 89.4 fL (ref 80.0–100.0)
Platelets: 247 10*3/uL (ref 150–400)
RBC: 3.21 MIL/uL — ABNORMAL LOW (ref 3.87–5.11)
RDW: 13 % (ref 11.5–15.5)
WBC: 16.7 10*3/uL — ABNORMAL HIGH (ref 4.0–10.5)
nRBC: 0 % (ref 0.0–0.2)

## 2021-01-06 LAB — RPR: RPR Ser Ql: NONREACTIVE

## 2021-01-06 MED ORDER — IBUPROFEN 200 MG PO TABS: 600.0000 mg | ORAL_TABLET | Freq: Four times a day (QID) | ORAL | Status: AC | PRN

## 2021-01-06 MED ORDER — ACETAMINOPHEN 325 MG PO TABS: 650.0000 mg | ORAL_TABLET | Freq: Four times a day (QID) | ORAL | Status: AC | PRN

## 2021-01-06 NOTE — Discharge Summary (Signed)
Postpartum Discharge Summary       Patient Name: LOIDA CALAMIA DOB: 08/17/1995 MRN: [id number]  Date of admission: 01/05/2021 Delivery date:01/05/2021  Delivering provider: Irene Pap E  Date of discharge: 01/06/2021  Admitting diagnosis: Normal labor [O80, Z37.9] Intrauterine pregnancy: [redacted]w[redacted]d     Secondary diagnosis:  Active Problems:   Normal labor  Additional problems: none    Discharge diagnosis: Term Pregnancy Delivered                                              Post partum procedures:none Augmentation: AROM Complications: None  Hospital course: Onset of Labor With Vaginal Delivery      25 y.o. yo G1P1001 at [redacted]w[redacted]d was admitted in Active Labor on 01/05/2021. Patient delivered precipitously as follows:  Membrane Rupture Time/Date: 10:33 AM ,01/05/2021   Delivery Method:Vaginal, Spontaneous  Episiotomy: None  Lacerations:  None  Patient had an uncomplicated postpartum course.  She is ambulating, tolerating a regular diet, passing flatus, and urinating well. Patient is discharged home in stable condition on 01/06/21.  Newborn Data: Birth date:01/05/2021  Birth time:10:36 AM  Gender:Female  Living status:Living  Apgars:8 ,9  Weight:3239 g   Magnesium Sulfate received: No BMZ received: No Rhophylac:N/A MMR:N/A T-DaP:Given prenatally Flu: No Transfusion:No  Physical exam  Vitals:   01/05/21 2047 01/05/21 2100 01/06/21 0034 01/06/21 0430  BP:  114/68 110/67 127/68  Pulse:  81 78 76  Resp:  $Remo'18 18 18  'YMppQ$ Temp:  97.8 F (36.6 C) 98 F (36.7 C) 98 F (36.7 C)  TempSrc:  Oral Oral Oral  SpO2: 99% 98% 98%    General: alert, cooperative and no distress Lochia: appropriate Uterine Fundus: firm Incision: N/A DVT Evaluation: No evidence of DVT seen on physical exam. Labs: Lab Results  Component Value Date   WBC 16.7 (H) 01/06/2021   HGB 10.1 (L) 01/06/2021   HCT 28.7 (L) 01/06/2021   MCV 89.4 01/06/2021   PLT 247 01/06/2021   No flowsheet data  found. Edinburgh Score: No flowsheet data found.    After visit meds:  Allergies as of 01/06/2021   No Known Allergies     Medication List    TAKE these medications   acetaminophen 325 MG tablet Commonly known as: TYLENOL Take 2 tablets (650 mg total) by mouth every 6 (six) hours as needed for moderate pain.   ibuprofen 200 MG tablet Commonly known as: ADVIL Take 3 tablets (600 mg total) by mouth every 6 (six) hours as needed for cramping.   PRENATAL VITAMIN PO Take by mouth.        Discharge home in stable condition Infant Feeding: Breast Infant Disposition:home with mother Discharge instruction: per After Visit Summary and Postpartum booklet. Activity: Advance as tolerated. Pelvic rest for 6 weeks.  Diet: routine diet Anticipated Birth Control: Unsure Postpartum Appointment:4 weeks Additional Postpartum F/U: none Future Appointments:No future appointments. Follow up Visit:  Follow-up Information    Rowland Lathe, MD Follow up in 4 week(s).   Specialty: Obstetrics and Gynecology Contact information: [address] [place] [address] [phone]                   01/06/2021 Rowland Lathe, MD

## 2021-01-06 NOTE — Plan of Care (Signed)

## 2021-01-06 NOTE — Discharge Instructions (Signed)

## 2021-01-06 NOTE — Progress Notes (Addendum)
Post Partum Day 1 Subjective: Colleen Daniel is doing well this morning. Reports mild abdominal cramping that is relieved by ibuprofen. She is ambulating, voiding, tolerating PO. Minimal lochia.   Objective: Patient Vitals for the past 24 hrs:  BP Temp Temp src Pulse Resp SpO2  01/06/21 0430 127/68 98 F (36.7 C) Oral 76 18 --  01/06/21 0034 110/67 98 F (36.7 C) Oral 78 18 98 %  01/05/21 2100 114/68 97.8 F (36.6 C) Oral 81 18 98 %  01/05/21 2047 -- -- -- -- -- 99 %  01/05/21 1731 114/69 98.2 F (36.8 C) -- -- 18 100 %  01/05/21 1315 128/79 98.3 F (36.8 C) Oral 89 18 --  01/05/21 1228 122/84 -- -- 91 18 99 %  01/05/21 1201 126/84 -- -- 93 -- --  01/05/21 1146 127/89 -- -- 88 -- --  01/05/21 1131 105/86 -- -- 88 -- --  01/05/21 1115 128/89 -- -- 94 -- --  01/05/21 1104 129/74 -- -- 91 -- --  01/05/21 1050 (!) 92/56 -- -- 97 -- --    Physical Exam:  General: alert, cooperative and no distress Lochia: appropriate Uterine Fundus: firm DVT Evaluation: No evidence of DVT seen on physical exam.  Recent Labs    01/05/21 1203 01/06/21 0434  WBC 22.9* 16.7*  HGB 12.2 10.1*  HCT 34.6* 28.7*  PLT 263 247    No results for input(s): NA, K, CL, CO2CT, BUN, CREATININE, GLUCOSE, BILITOT, ALT, AST, ALKPHOS, PROT, ALBUMIN in the last 72 hours.  No results for input(s): CALCIUM, MG, PHOS in the last 72 hours.  No results for input(s): PROTIME, APTT, INR in the last 72 hours.  No results for input(s): PROTIME, APTT, INR, FIBRINOGEN in the last 72 hours. Assessment/Plan: Colleen Daniel 24 y.o. G1P1001 PPD#1 sp SVD 1. PPC: continue routine postpartum care 2. Rh pos, rubella immune. S/p tdap prenatally.  3. Desires circumcision however baby noted to have hypospadias by pediatrician so will defer 4. Dispo: discussed pediatrician will likely recommend 48h observation for baby given untreated GBS+ due to precipitous labor. If not, could consider discharge home later today vs tomorrow  LOS: 1  day   Charlett Nose 01/06/2021, 9:23 AM    ADDENDUM: Notified that pediatrician is discharging baby, low risk for infection due to AROM just immediately prior to delivery. Colleen Daniel is requesting discharge. She has excellent support with her mom and husband and uncomplicated course. Will place discharge order.  Alinda Deem, MD 01/06/21 10:06 AM

## 2021-01-06 NOTE — Lactation Note (Signed)
This note was copied from a baby's chart. Lactation Consultation Note  Patient Name: Colleen Daniel [id number] Date: 01/06/2021 Reason for consult: Follow-up assessment;Term;Primapara;1st time breastfeeding Age:25 hours   P1 mother whose infant is now 26 hours old.  This is a term baby at 39+0 weeks.  Baby was asleep at the breast when I arrived.  Offered to assist with waking and latching; mother agreeable.  Attempted to latch, however, baby was too sleepy.  Mother reported good feedings at 0015 and 0315.  Placed him STS on mother's chest where he fell asleep.  Suggested she call her RN/LC for assistance as needed.  Grandmother present.  Mother has a Web designer and a [employer] pump for home use.   Maternal Data Has patient been taught Hand Expression?: Yes Does the patient have breastfeeding experience prior to this delivery?: No  Feeding Mother's Current Feeding Choice: Breast Milk  LATCH Score Latch: Too sleepy or reluctant, no latch achieved, no sucking elicited.  Audible Swallowing: None  Type of Nipple: Everted at rest and after stimulation  Comfort (Breast/Nipple): Soft / non-tender  Hold (Positioning): Assistance needed to correctly position infant at breast and maintain latch.  LATCH Score: 5   Lactation Tools Discussed/Used    Interventions Interventions: Breast feeding basics reviewed;Assisted with latch;Skin to skin;Breast compression;Education  Discharge WIC Program: No  Consult Status Consult Status: Follow-up Date: 01/07/21 Follow-up type: In-patient    Loudon Krakow R Keashia Haskins 01/06/2021, 7:19 AM

## 2021-04-03 ENCOUNTER — Other Ambulatory Visit: Payer: Self-pay | Admitting: Obstetrics and Gynecology

## 2021-04-03 DIAGNOSIS — R319 Hematuria, unspecified: Secondary | ICD-10-CM

## 2022-02-28 DIAGNOSIS — Z01419 Encounter for gynecological examination (general) (routine) without abnormal findings: Secondary | ICD-10-CM | POA: Diagnosis not present

## 2022-06-25 ENCOUNTER — Ambulatory Visit (INDEPENDENT_AMBULATORY_CARE_PROVIDER_SITE_OTHER): Payer: BC Managed Care – PPO | Admitting: Family Medicine

## 2022-06-25 ENCOUNTER — Encounter: Payer: Self-pay | Admitting: Family Medicine

## 2022-06-25 VITALS — BP 123/80 | HR 75 | Ht 66.0 in | Wt 154.0 lb

## 2022-06-25 DIAGNOSIS — Z0000 Encounter for general adult medical examination without abnormal findings: Secondary | ICD-10-CM

## 2022-06-25 NOTE — Patient Instructions (Signed)
Follow up annually.  Consider IUD.  Take care  Dr. Adriana Simas

## 2022-06-26 DIAGNOSIS — Z0000 Encounter for general adult medical examination without abnormal findings: Secondary | ICD-10-CM | POA: Insufficient documentation

## 2022-06-26 NOTE — Progress Notes (Signed)
Subjective:  Patient ID: Colleen Daniel, female    DOB: 12-02-1995  Age: 26 y.o. MRN: [id number]  CC: Chief Complaint  Patient presents with   Establish Care    No regular physician since 26 years of age. No questions/concerns at this time.    HPI:  26 year old female presents to establish care.   Patient follows with OB GYN. Has a 28 month old baby at home. Currently on OCP for contraception.  Does not want flu vaccine. Need records but Tdap and Pap are up to date.   She is overall doing well. Having issues with periods despite OCP. Discussed IUD today. Advised to consider and discuss with OB GYN.   Social Hx   Social History   Socioeconomic History   Marital status: Single    Spouse name: Not on file   Number of children: Not on file   Years of education: Not on file   Highest education level: Not on file  Occupational History   Not on file  Tobacco Use   Smoking status: Never   Smokeless tobacco: Never  Vaping Use   Vaping Use: Never used  Substance and Sexual Activity   Alcohol use: Not Currently    Comment: social   Drug use: Never   Sexual activity: Yes  Other Topics Concern   Not on file  Social History Narrative   Not on file   Social Determinants of Health   Financial Resource Strain: Not on file  Food Insecurity: Not on file  Transportation Needs: Not on file  Physical Activity: Not on file  Stress: Not on file  Social Connections: Not on file    Review of Systems  Constitutional: Negative.   Respiratory: Negative.    Cardiovascular: Negative.    Objective:  BP 123/80   Pulse 75   Ht 5\' 6"  (1.676 m)   Wt 154 lb (69.9 kg)   SpO2 96%   BMI 24.86 kg/m      06/25/2022    2:39 PM 01/06/2021    4:30 AM 01/06/2021   12:34 AM  BP/Weight  Systolic BP 123 127 110  Diastolic BP 80 68 67  Wt. (Lbs) 154    BMI 24.86 kg/m2      Physical Exam Vitals and nursing note reviewed.  Constitutional:      General: She is not in acute distress.     Appearance: Normal appearance.  HENT:     Head: Normocephalic and atraumatic.     Nose: Nose normal.     Mouth/Throat:     Pharynx: Oropharynx is clear.  Eyes:     General:        Right eye: No discharge.        Left eye: No discharge.     Conjunctiva/sclera: Conjunctivae normal.  Cardiovascular:     Rate and Rhythm: Normal rate and regular rhythm.  Pulmonary:     Effort: Pulmonary effort is normal.     Breath sounds: Normal breath sounds. No wheezing or rales.  Abdominal:     General: There is no distension.     Palpations: Abdomen is soft.     Tenderness: There is no abdominal tenderness.  Skin:    General: Skin is warm.     Findings: No rash.  Neurological:     General: No focal deficit present.     Mental Status: She is alert.  Psychiatric:        Mood and Affect: Mood  normal.        Behavior: Behavior normal.    Assessment & Plan:   Problem List Items Addressed This Visit       Other   Annual physical exam - Primary    Doing well. Declines flu vaccine. Declines labs. Discussed IUD. Follow up annually.       Follow-up:  Annually  Everlene Other DO [facility]

## 2022-06-26 NOTE — Assessment & Plan Note (Signed)
Doing well. Declines flu vaccine. Declines labs. Discussed IUD. Follow up annually.

## 2022-09-16 DIAGNOSIS — R309 Painful micturition, unspecified: Secondary | ICD-10-CM | POA: Diagnosis not present

## 2023-03-10 DIAGNOSIS — Z01419 Encounter for gynecological examination (general) (routine) without abnormal findings: Secondary | ICD-10-CM | POA: Diagnosis not present

## 2023-03-10 DIAGNOSIS — Z124 Encounter for screening for malignant neoplasm of cervix: Secondary | ICD-10-CM | POA: Diagnosis not present

## 2023-03-10 DIAGNOSIS — Z6823 Body mass index (BMI) 23.0-23.9, adult: Secondary | ICD-10-CM | POA: Diagnosis not present

## 2023-06-02 ENCOUNTER — Telehealth: Payer: Self-pay | Admitting: Family Medicine

## 2023-06-02 DIAGNOSIS — Z131 Encounter for screening for diabetes mellitus: Secondary | ICD-10-CM

## 2023-06-02 DIAGNOSIS — Z130 Encounter for screening for diseases of the blood and blood-forming organs and certain disorders involving the immune mechanism: Secondary | ICD-10-CM

## 2023-06-02 DIAGNOSIS — Z13220 Encounter for screening for lipoid disorders: Secondary | ICD-10-CM

## 2023-06-02 NOTE — Telephone Encounter (Signed)
Patient has physical 11/21 and would like to have labs done before physical this is for her insurance premiums.

## 2023-06-03 ENCOUNTER — Encounter: Payer: Self-pay | Admitting: *Deleted

## 2023-06-03 NOTE — Telephone Encounter (Signed)
Blood work ordered in EPIC. Patient notified via my chart 

## 2023-06-03 NOTE — Telephone Encounter (Signed)
Cook, Jayce G, DO     CBC, CMP, Lipid.   

## 2023-07-03 ENCOUNTER — Encounter: Payer: Self-pay | Admitting: Family Medicine

## 2023-07-03 ENCOUNTER — Ambulatory Visit: Payer: BC Managed Care – PPO | Admitting: Family Medicine

## 2023-07-03 VITALS — BP 116/78 | HR 71 | Temp 97.9°F | Ht 66.0 in | Wt 143.0 lb

## 2023-07-03 DIAGNOSIS — Z0001 Encounter for general adult medical examination with abnormal findings: Secondary | ICD-10-CM

## 2023-07-03 DIAGNOSIS — Z13220 Encounter for screening for lipoid disorders: Secondary | ICD-10-CM

## 2023-07-03 DIAGNOSIS — Z0000 Encounter for general adult medical examination without abnormal findings: Secondary | ICD-10-CM

## 2023-07-03 DIAGNOSIS — Z13228 Encounter for screening for other metabolic disorders: Secondary | ICD-10-CM

## 2023-07-03 DIAGNOSIS — Z130 Encounter for screening for diseases of the blood and blood-forming organs and certain disorders involving the immune mechanism: Secondary | ICD-10-CM

## 2023-07-03 DIAGNOSIS — G43909 Migraine, unspecified, not intractable, without status migrainosus: Secondary | ICD-10-CM

## 2023-07-03 MED ORDER — RIZATRIPTAN BENZOATE 10 MG PO TBDP: 10.0000 mg | ORAL_TABLET | ORAL | 0 refills | Status: AC | PRN

## 2023-07-03 NOTE — Patient Instructions (Signed)
Labs today.  Medication as needed.  Take care  Dr. Adriana Simas

## 2023-07-03 NOTE — Progress Notes (Signed)
Subjective:  Patient ID: Colleen Daniel, female    DOB: 1995-12-13  Age: 27 y.o. MRN: [id number]  CC:   Chief Complaint  Patient presents with   Annual Exam    Discuss ocular migraines, irregular periods and wants to stop BCP See GYN for yearly paps    HPI:  27 year old female presents for an annual exam.  Patient is overall doing well.  She does report that she is having irregular cycles.  She desires pregnancy in the near future.  She would like to discuss stopping her birth control.  Patient also reports ongoing migraines.  She states that it is preceded by visual disturbance.  She takes ibuprofen.  Will discuss prophylactic and abortive treatment today.  Cervical cancer screening is up-to-date.  Need records from OB/GYN.  Declines influenza vaccine.  Declines COVID-19 vaccine.  She should be up-to-date on Tdap as her child is less than the age of 59.  Patient Active Problem List   Diagnosis Date Noted   Migraine 07/03/2023   Annual physical exam 06/26/2022    Social Hx   Social History   Socioeconomic History   Marital status: Single    Spouse name: Not on file   Number of children: Not on file   Years of education: Not on file   Highest education level: Not on file  Occupational History   Not on file  Tobacco Use   Smoking status: Never   Smokeless tobacco: Never  Vaping Use   Vaping status: Never Used  Substance and Sexual Activity   Alcohol use: Not Currently    Comment: social   Drug use: Never   Sexual activity: Yes  Other Topics Concern   Not on file  Social History Narrative   Not on file   Social Determinants of Health   Financial Resource Strain: Not on file  Food Insecurity: Not on file  Transportation Needs: Not on file  Physical Activity: Not on file  Stress: Not on file  Social Connections: Not on file    Review of Systems Per HPI  Objective:  BP 116/78   Pulse 71   Temp 97.9 F (36.6 C)   Ht 5\' 6"  (1.676 m)   Wt 143 lb (64.9  kg)   LMP 05/20/2023 (Exact Date)   SpO2 99%   BMI 23.08 kg/m      07/03/2023    1:22 PM 06/25/2022    2:39 PM 01/06/2021    4:30 AM  BP/Weight  Systolic BP 116 123 127  Diastolic BP 78 80 68  Wt. (Lbs) 143 154   BMI 23.08 kg/m2 24.86 kg/m2     Physical Exam Vitals and nursing note reviewed.  Constitutional:      General: She is not in acute distress.    Appearance: Normal appearance.  HENT:     Head: Normocephalic and atraumatic.  Eyes:     General:        Right eye: No discharge.        Left eye: No discharge.     Conjunctiva/sclera: Conjunctivae normal.  Cardiovascular:     Rate and Rhythm: Normal rate and regular rhythm.  Pulmonary:     Effort: Pulmonary effort is normal.     Breath sounds: Normal breath sounds. No wheezing, rhonchi or rales.  Neurological:     Mental Status: She is alert.  Psychiatric:        Mood and Affect: Mood normal.  Behavior: Behavior normal.     Lab Results  Component Value Date   WBC 16.7 (H) 01/06/2021   HGB 10.1 (L) 01/06/2021   HCT 28.7 (L) 01/06/2021   PLT 247 01/06/2021     Assessment & Plan:   Problem List Items Addressed This Visit       Cardiovascular and Mediastinum   Migraine    Patient does not desire preventative treatment at this time.  She states that her migraines are not frequent enough to warrant preventative treatment.  Rx sent for Maxalt for abortive treatment.      Relevant Medications   rizatriptan (MAXALT-MLT) 10 MG disintegrating tablet     Other   Annual physical exam - Primary    Preventative health care is up-to-date.  She declines flu vaccine and COVID-19 vaccine.  Tetanus should be up-to-date.  Need records regarding the date of her last Pap smear. Screening labs today.      Other Visit Diagnoses     Screening for deficiency anemia       Relevant Orders   CBC   Screening, lipid       Relevant Orders   Lipid panel   Screening for metabolic disorder       Relevant Orders    CMP14+EGFR       Meds ordered this encounter  Medications   rizatriptan (MAXALT-MLT) 10 MG disintegrating tablet    Sig: Take 1 tablet (10 mg total) by mouth as needed for migraine. May repeat in 2 hours if needed    Dispense:  10 tablet    Refill:  0    Follow-up:  Annually  Everlene Other DO [facility]

## 2023-07-03 NOTE — Assessment & Plan Note (Signed)
Patient does not desire preventative treatment at this time.  She states that her migraines are not frequent enough to warrant preventative treatment.  Rx sent for Maxalt for abortive treatment.

## 2023-07-03 NOTE — Assessment & Plan Note (Signed)
Preventative health care is up-to-date.  She declines flu vaccine and COVID-19 vaccine.  Tetanus should be up-to-date.  Need records regarding the date of her last Pap smear. Screening labs today.

## 2023-09-16 DIAGNOSIS — S233XXA Sprain of ligaments of thoracic spine, initial encounter: Secondary | ICD-10-CM | POA: Diagnosis not present

## 2023-09-16 DIAGNOSIS — S338XXA Sprain of other parts of lumbar spine and pelvis, initial encounter: Secondary | ICD-10-CM | POA: Diagnosis not present

## 2023-09-16 DIAGNOSIS — S134XXA Sprain of ligaments of cervical spine, initial encounter: Secondary | ICD-10-CM | POA: Diagnosis not present

## 2023-09-23 DIAGNOSIS — S233XXA Sprain of ligaments of thoracic spine, initial encounter: Secondary | ICD-10-CM | POA: Diagnosis not present

## 2023-09-23 DIAGNOSIS — S338XXA Sprain of other parts of lumbar spine and pelvis, initial encounter: Secondary | ICD-10-CM | POA: Diagnosis not present

## 2023-09-23 DIAGNOSIS — S134XXA Sprain of ligaments of cervical spine, initial encounter: Secondary | ICD-10-CM | POA: Diagnosis not present

## 2023-09-30 DIAGNOSIS — S338XXA Sprain of other parts of lumbar spine and pelvis, initial encounter: Secondary | ICD-10-CM | POA: Diagnosis not present

## 2023-09-30 DIAGNOSIS — S233XXA Sprain of ligaments of thoracic spine, initial encounter: Secondary | ICD-10-CM | POA: Diagnosis not present

## 2023-09-30 DIAGNOSIS — S134XXA Sprain of ligaments of cervical spine, initial encounter: Secondary | ICD-10-CM | POA: Diagnosis not present

## 2024-01-01 DIAGNOSIS — R0981 Nasal congestion: Secondary | ICD-10-CM | POA: Diagnosis not present

## 2024-01-01 DIAGNOSIS — J019 Acute sinusitis, unspecified: Secondary | ICD-10-CM | POA: Diagnosis not present

## 2024-01-01 DIAGNOSIS — Z6822 Body mass index (BMI) 22.0-22.9, adult: Secondary | ICD-10-CM | POA: Diagnosis not present

## 2024-03-15 DIAGNOSIS — Z01419 Encounter for gynecological examination (general) (routine) without abnormal findings: Secondary | ICD-10-CM | POA: Diagnosis not present

## 2024-05-13 DIAGNOSIS — N925 Other specified irregular menstruation: Secondary | ICD-10-CM | POA: Diagnosis not present

## 2024-05-13 DIAGNOSIS — Z113 Encounter for screening for infections with a predominantly sexual mode of transmission: Secondary | ICD-10-CM | POA: Diagnosis not present

## 2024-05-13 DIAGNOSIS — Z3480 Encounter for supervision of other normal pregnancy, unspecified trimester: Secondary | ICD-10-CM | POA: Diagnosis not present

## 2024-05-13 DIAGNOSIS — Z3201 Encounter for pregnancy test, result positive: Secondary | ICD-10-CM | POA: Diagnosis not present

## 2024-05-27 DIAGNOSIS — Z3481 Encounter for supervision of other normal pregnancy, first trimester: Secondary | ICD-10-CM | POA: Diagnosis not present

## 2024-05-27 DIAGNOSIS — Z3480 Encounter for supervision of other normal pregnancy, unspecified trimester: Secondary | ICD-10-CM | POA: Diagnosis not present

## 2024-05-27 DIAGNOSIS — Z369 Encounter for antenatal screening, unspecified: Secondary | ICD-10-CM | POA: Diagnosis not present

## 2024-05-30 LAB — LAB REPORT - SCANNED
A1c: 5.1
HM HIV Screening: NEGATIVE
HM Hepatitis Screen: NEGATIVE

## 2024-06-22 DIAGNOSIS — Z369 Encounter for antenatal screening, unspecified: Secondary | ICD-10-CM | POA: Diagnosis not present

## 2024-07-12 DIAGNOSIS — Z369 Encounter for antenatal screening, unspecified: Secondary | ICD-10-CM | POA: Diagnosis not present

## 2024-07-12 DIAGNOSIS — Z3482 Encounter for supervision of other normal pregnancy, second trimester: Secondary | ICD-10-CM | POA: Diagnosis not present

## 2024-08-02 DIAGNOSIS — Z3A19 19 weeks gestation of pregnancy: Secondary | ICD-10-CM | POA: Diagnosis not present

## 2024-08-02 DIAGNOSIS — Z363 Encounter for antenatal screening for malformations: Secondary | ICD-10-CM | POA: Diagnosis not present

## 2024-09-08 ENCOUNTER — Other Ambulatory Visit: Payer: Self-pay | Admitting: Obstetrics and Gynecology

## 2024-09-08 ENCOUNTER — Encounter: Payer: Self-pay | Admitting: Obstetrics and Gynecology

## 2024-09-08 DIAGNOSIS — O283 Abnormal ultrasonic finding on antenatal screening of mother: Secondary | ICD-10-CM

## 2024-09-16 DIAGNOSIS — O35DXX0 Maternal care for other (suspected) fetal abnormality and damage, fetal gastrointestinal anomalies, not applicable or unspecified: Secondary | ICD-10-CM | POA: Insufficient documentation

## 2024-09-17 ENCOUNTER — Ambulatory Visit

## 2024-09-17 ENCOUNTER — Ambulatory Visit: Admitting: Obstetrics and Gynecology

## 2024-09-17 VITALS — BP 133/67 | HR 88

## 2024-09-17 DIAGNOSIS — O283 Abnormal ultrasonic finding on antenatal screening of mother: Secondary | ICD-10-CM

## 2024-09-17 DIAGNOSIS — O35DXX0 Maternal care for other (suspected) fetal abnormality and damage, fetal gastrointestinal anomalies, not applicable or unspecified: Secondary | ICD-10-CM

## 2024-09-17 DIAGNOSIS — Z3A26 26 weeks gestation of pregnancy: Secondary | ICD-10-CM

## 2024-09-17 NOTE — Progress Notes (Signed)
 Maternal-Fetal Medicine Consultation  Name: Colleen Daniel  MRN: [id number]  GA: [id number] [redacted]w[redacted]d   Patient is here for a second opinion.  She had fetal anatomical survey at your office and echogenic bowel was detected.  Subsequently, patient had viral studies that were negative.  She is not a carrier of cystic fibrosis mutation.    On cell-free fetal DNA screening, the risks of fetal aneuploidies are not increased.  MSAFP screening showed low risk for open neural tube defects.  Obstetrical history significant for a term vaginal delivery in 2022 of her female infant weighing 7 pounds and 2 ounces at birth.  He had hypospadias and had surgery.  Apparently, early pregnancy scan showed vanishing twin and a dichorionic-diamniotic twin pregnancy.  Ultrasound Fetal growth is appropriate for gestational age.  Normal amniotic fluid.  No markers of aneuploidies or obvious fetal structural defects are seen.  Fetal bowel appears normal with no evidence of hyperechogenicity.  Intracranial structures appear normal.  No evidence of calcification in the abdomen or in the brain.  I reassured the patient of normal findings on today's ultrasound.    I discussed the possible causes of echogenic bowel including Down syndrome (unlikely with normal NIPT), cystic fibrosis (negative screening), infection (negative), vaginal bleeding, and normal fetus.  Echogenic bowel can be present in fetal growth restriction and I recommend fetal growth assessment at [redacted] weeks gestation. She does not give history of vaginal bleeding in this pregnancy.  Recommendations - CMV and toxoplasmosis serologies if not performed already. - Fetal growth assessment at [redacted] weeks gestation.     Consultation including face-to-face (more than 50%) counseling 30 minutes.
